# Patient Record
Sex: Male | Born: 1960 | Race: Black or African American | Hispanic: No | Marital: Single | Smoking: Current every day smoker
Health system: Southern US, Community
[De-identification: ages and names within clinical notes are randomized; demographics above are authoritative.]

## PROBLEM LIST (undated history)

## (undated) DIAGNOSIS — F32A Depression, unspecified: Secondary | ICD-10-CM

## (undated) DIAGNOSIS — Z21 Asymptomatic human immunodeficiency virus [HIV] infection status: Secondary | ICD-10-CM

## (undated) DIAGNOSIS — F4310 Post-traumatic stress disorder, unspecified: Secondary | ICD-10-CM

## (undated) DIAGNOSIS — B20 Human immunodeficiency virus [HIV] disease: Secondary | ICD-10-CM

## (undated) DIAGNOSIS — F329 Major depressive disorder, single episode, unspecified: Secondary | ICD-10-CM

## (undated) HISTORY — PX: RECTAL SURGERY: SHX760

---

## 2002-06-19 ENCOUNTER — Emergency Department (HOSPITAL_COMMUNITY): Admission: EM | Admit: 2002-06-19 | Discharge: 2002-06-19 | Payer: Self-pay | Admitting: Emergency Medicine

## 2003-01-14 ENCOUNTER — Encounter: Payer: Self-pay | Admitting: Emergency Medicine

## 2003-01-14 ENCOUNTER — Emergency Department (HOSPITAL_COMMUNITY): Admission: EM | Admit: 2003-01-14 | Discharge: 2003-01-14 | Payer: Self-pay | Admitting: Emergency Medicine

## 2003-08-24 ENCOUNTER — Emergency Department (HOSPITAL_COMMUNITY): Admission: EM | Admit: 2003-08-24 | Discharge: 2003-08-25 | Payer: Self-pay

## 2004-01-11 ENCOUNTER — Emergency Department (HOSPITAL_COMMUNITY): Admission: EM | Admit: 2004-01-11 | Discharge: 2004-01-11 | Payer: Self-pay | Admitting: Family Medicine

## 2006-03-31 ENCOUNTER — Emergency Department: Payer: Self-pay | Admitting: Emergency Medicine

## 2009-09-19 ENCOUNTER — Emergency Department: Payer: Self-pay | Admitting: Emergency Medicine

## 2015-09-17 ENCOUNTER — Encounter: Payer: Self-pay | Admitting: Emergency Medicine

## 2015-09-17 ENCOUNTER — Emergency Department: Payer: No Typology Code available for payment source

## 2015-09-17 ENCOUNTER — Emergency Department
Admission: EM | Admit: 2015-09-17 | Discharge: 2015-09-17 | Disposition: A | Discharge status: Home / Self Care | Payer: No Typology Code available for payment source | Attending: Student | Admitting: Student

## 2015-09-17 DIAGNOSIS — Y9389 Activity, other specified: Secondary | ICD-10-CM | POA: Diagnosis not present

## 2015-09-17 DIAGNOSIS — S299XXA Unspecified injury of thorax, initial encounter: Secondary | ICD-10-CM | POA: Diagnosis present

## 2015-09-17 DIAGNOSIS — F17210 Nicotine dependence, cigarettes, uncomplicated: Secondary | ICD-10-CM | POA: Diagnosis not present

## 2015-09-17 DIAGNOSIS — Y999 Unspecified external cause status: Secondary | ICD-10-CM | POA: Insufficient documentation

## 2015-09-17 DIAGNOSIS — S20219A Contusion of unspecified front wall of thorax, initial encounter: Secondary | ICD-10-CM | POA: Diagnosis not present

## 2015-09-17 DIAGNOSIS — Y92410 Unspecified street and highway as the place of occurrence of the external cause: Secondary | ICD-10-CM | POA: Insufficient documentation

## 2015-09-17 MED ORDER — NAPROXEN 500 MG PO TBEC: 500.0000 mg | DELAYED_RELEASE_TABLET | Freq: Two times a day (BID) | ORAL | Status: DC

## 2015-09-17 MED ORDER — CYCLOBENZAPRINE HCL 5 MG PO TABS: 5.0000 mg | ORAL_TABLET | Freq: Three times a day (TID) | ORAL | Status: DC | PRN
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
## 2015-10-24 ENCOUNTER — Emergency Department: Payer: Medicare Other

## 2015-10-24 ENCOUNTER — Emergency Department
Admission: EM | Admit: 2015-10-24 | Discharge: 2015-10-24 | Disposition: A | Discharge status: Home / Self Care | Payer: Medicare Other | Attending: Student | Admitting: Student

## 2015-10-24 ENCOUNTER — Encounter: Payer: Self-pay | Admitting: Emergency Medicine

## 2015-10-24 DIAGNOSIS — B20 Human immunodeficiency virus [HIV] disease: Secondary | ICD-10-CM | POA: Diagnosis not present

## 2015-10-24 DIAGNOSIS — M549 Dorsalgia, unspecified: Secondary | ICD-10-CM

## 2015-10-24 DIAGNOSIS — M545 Low back pain: Secondary | ICD-10-CM | POA: Diagnosis present

## 2015-10-24 DIAGNOSIS — Z79899 Other long term (current) drug therapy: Secondary | ICD-10-CM | POA: Insufficient documentation

## 2015-10-24 DIAGNOSIS — F17210 Nicotine dependence, cigarettes, uncomplicated: Secondary | ICD-10-CM | POA: Diagnosis not present

## 2015-10-24 HISTORY — DX: Asymptomatic human immunodeficiency virus (hiv) infection status: Z21

## 2015-10-24 HISTORY — DX: Human immunodeficiency virus (HIV) disease: B20

## 2015-10-24 LAB — URINALYSIS COMPLETE WITH MICROSCOPIC (ARMC ONLY)
BILIRUBIN URINE: NEGATIVE
Bacteria, UA: NONE SEEN
GLUCOSE, UA: NEGATIVE mg/dL
Hgb urine dipstick: NEGATIVE
Ketones, ur: NEGATIVE mg/dL
LEUKOCYTES UA: NEGATIVE
NITRITE: NEGATIVE
Protein, ur: NEGATIVE mg/dL
SPECIFIC GRAVITY, URINE: 1.02 (ref 1.005–1.030)
pH: 6 (ref 5.0–8.0)

## 2015-10-24 MED ORDER — KETOROLAC TROMETHAMINE 30 MG/ML IJ SOLN
15.0000 mg | Freq: Once | INTRAMUSCULAR | Status: AC
  Administered 2015-10-24: 15 mg via INTRAMUSCULAR
  Filled 2015-10-24: qty 1

## 2015-10-24 MED ORDER — CYCLOBENZAPRINE HCL 5 MG PO TABS: 5.0000 mg | ORAL_TABLET | Freq: Three times a day (TID) | ORAL | Status: DC | PRN

## 2015-10-24 MED ORDER — NAPROXEN 500 MG PO TABS: 500.0000 mg | ORAL_TABLET | Freq: Two times a day (BID) | ORAL | Status: AC
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
## 2015-10-24 NOTE — ED Notes (Signed)
Pt verbalized understanding of discharge instructions. NAD at this time. 
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
## 2015-10-24 NOTE — ED Notes (Signed)
Patient transported to X-ray 

## 2016-01-06 ENCOUNTER — Emergency Department: Payer: Medicare Other

## 2016-01-06 ENCOUNTER — Emergency Department
Admission: EM | Admit: 2016-01-06 | Discharge: 2016-01-06 | Disposition: A | Discharge status: Home / Self Care | Payer: Medicare Other | Attending: Emergency Medicine | Admitting: Emergency Medicine

## 2016-01-06 ENCOUNTER — Encounter: Payer: Self-pay | Admitting: Emergency Medicine

## 2016-01-06 DIAGNOSIS — X58XXXA Exposure to other specified factors, initial encounter: Secondary | ICD-10-CM | POA: Insufficient documentation

## 2016-01-06 DIAGNOSIS — Z79899 Other long term (current) drug therapy: Secondary | ICD-10-CM | POA: Insufficient documentation

## 2016-01-06 DIAGNOSIS — Z21 Asymptomatic human immunodeficiency virus [HIV] infection status: Secondary | ICD-10-CM | POA: Diagnosis not present

## 2016-01-06 DIAGNOSIS — S46911A Strain of unspecified muscle, fascia and tendon at shoulder and upper arm level, right arm, initial encounter: Secondary | ICD-10-CM | POA: Diagnosis not present

## 2016-01-06 DIAGNOSIS — F17210 Nicotine dependence, cigarettes, uncomplicated: Secondary | ICD-10-CM | POA: Diagnosis not present

## 2016-01-06 DIAGNOSIS — Y999 Unspecified external cause status: Secondary | ICD-10-CM | POA: Insufficient documentation

## 2016-01-06 DIAGNOSIS — Y939 Activity, unspecified: Secondary | ICD-10-CM | POA: Diagnosis not present

## 2016-01-06 DIAGNOSIS — Z791 Long term (current) use of non-steroidal anti-inflammatories (NSAID): Secondary | ICD-10-CM | POA: Diagnosis not present

## 2016-01-06 DIAGNOSIS — Y929 Unspecified place or not applicable: Secondary | ICD-10-CM | POA: Insufficient documentation

## 2016-01-06 DIAGNOSIS — M25511 Pain in right shoulder: Secondary | ICD-10-CM | POA: Diagnosis present

## 2016-01-06 HISTORY — DX: Post-traumatic stress disorder, unspecified: F43.10

## 2016-01-06 HISTORY — DX: Depression, unspecified: F32.A

## 2016-01-06 HISTORY — DX: Major depressive disorder, single episode, unspecified: F32.9

## 2016-01-06 MED ORDER — METHOCARBAMOL 500 MG PO TABS: 500.0000 mg | ORAL_TABLET | Freq: Four times a day (QID) | ORAL | 1 refills | Status: DC

## 2016-01-06 MED ORDER — MELOXICAM 15 MG PO TABS: 15.0000 mg | ORAL_TABLET | Freq: Every day | ORAL | 1 refills | Status: DC

## 2016-01-06 MED ORDER — MELOXICAM 7.5 MG PO TABS
15.0000 mg | ORAL_TABLET | Freq: Once | ORAL | Status: AC
  Administered 2016-01-06: 15 mg via ORAL
  Filled 2016-01-06: qty 2

## 2016-01-06 NOTE — ED Triage Notes (Signed)
Patient presents to ED with c/o right shoulder pain for 2 weeks. Pt denies known injury to the shoulder. Pt reports relief with elevation, reports taking OTC tylenol without relief.

## 2016-01-06 NOTE — ED Notes (Signed)
See triage note, pt reports R shoulder pain x2-3 weeks.  Denies injury, sts pain has not gotten worse but that he is tried of it hurting.  Reports some relief w/ tylenol.

## 2016-01-06 NOTE — ED Provider Notes (Signed)
[facility] Emergency Department Provider Note  ____________________________________________  Time seen: Approximately 10:01 PM  I have reviewed the triage vital signs and the nursing notes.   HISTORY  Chief Complaint Shoulder Pain    HPI Paul Black is a 55 y.o. male who presents emergency department complaining of right shoulder pain 2-3 weeks. Patient denies any direct trauma to the site. He reports that pain feels like a "pulled muscle" to the posterior shoulder blade. Patient states that he receives some relief when he raises his hand above his head. He denies any numbness or tingling to his right upper extremity. He denies any neck pain. No other complaints. Patient straight Tylenol with no relief.   Past Medical History:  Diagnosis Date  . Depression   . HIV (human immunodeficiency virus infection) (HCC)   . PTSD (post-traumatic stress disorder)     There are no active problems to display for this patient.   Past Surgical History:  Procedure Laterality Date  . RECTAL SURGERY      Prior to Admission medications   Medication Sig Start Date End Date Taking? Authorizing Provider  cyclobenzaprine (FLEXERIL) 5 MG tablet Take 1 tablet (5 mg total) by mouth every 8 (eight) hours as needed for muscle spasms. 09/17/15   Jenise V Bacon Menshew, PA-C  cyclobenzaprine (FLEXERIL) 5 MG tablet Take 1 tablet (5 mg total) by mouth every 8 (eight) hours as needed for muscle spasms. Do not drive while taking this medication. 10/24/15   Gayla DossEryka A Gayle, MD  emtricitabine-tenofovir (TRUVADA) 200-300 MG tablet Take 1 tablet by mouth daily.    Historical Provider, MD  meloxicam (MOBIC) 15 MG tablet Take 1 tablet (15 mg total) by mouth daily. 01/06/16   Delorise RoyalsJonathan D Kalen Neidert, PA-C  methocarbamol (ROBAXIN) 500 MG tablet Take 1 tablet (500 mg total) by mouth 4 (four) times daily. 01/06/16   Delorise RoyalsJonathan D Barbarita Hutmacher, PA-C  naproxen (EC NAPROSYN) 500 MG EC tablet Take 1  tablet (500 mg total) by mouth 2 (two) times daily with a meal. 09/17/15   Jenise V Bacon Menshew, PA-C  naproxen (NAPROSYN) 500 MG tablet Take 1 tablet (500 mg total) by mouth 2 (two) times daily with a meal. 10/24/15 10/23/16  Gayla DossEryka A Gayle, MD  NORVIR 100 MG TABS tablet Take 100 mg by mouth daily. 09/27/15   Historical Provider, MD  PARoxetine (PAXIL) 40 MG tablet Take 40 mg by mouth daily as needed. For depression. 08/07/15   Historical Provider, MD  REYATAZ 300 MG capsule Take 300 mg by mouth daily. 09/27/15   Historical Provider, MD    Allergies Review of patient's allergies indicates no known allergies.  No family history on file.  Social History Social History  Substance Use Topics  . Smoking status: Current Every Day Smoker    Packs/day: 0.50    Types: Cigarettes  . Smokeless tobacco: Never Used  . Alcohol use No     Review of Systems  Constitutional: No fever/chills Cardiovascular: no chest pain. Respiratory: no cough. No SOB. Musculoskeletal: Positive for right shoulder pain Skin: Negative for rash, abrasions, lacerations, ecchymosis. Neurological: Negative for headaches, focal weakness or numbness. 10-point ROS otherwise negative.  ____________________________________________   PHYSICAL EXAM:  VITAL SIGNS: ED Triage Vitals [01/06/16 2121]  Enc Vitals Group     BP 134/89     Pulse Rate 97     Resp 18     Temp 98.4 F (36.9 C)     Temp Source Oral  SpO2 98 %     Weight 189 lb (85.7 kg)     Height 6\' 1"  (1.854 m)     Head Circumference      Peak Flow      Pain Score 9     Pain Loc      Pain Edu?      Excl. in [place]?      Constitutional: Alert and oriented. Well appearing and in no acute distress. Eyes: Conjunctivae are normal. PERRL. EOMI. Head: Atraumatic. Cardiovascular: Normal rate, regular rhythm. Normal S1 and S2.  Good peripheral circulation. Respiratory: Normal respiratory effort without tachypnea or retractions. Lungs CTAB. Good air entry to the  bases with no decreased or absent breath sounds. Musculoskeletal: Full range of motion to all extremities. No gross deformities appreciated.No deformity, edema noted to right shoulder. Full range of motion right shoulder. Patient is nontender to palpation of the osseous landmarks of the shoulder. Patient is diffusely tender to palpation over the posterior muscle group. No palpable abnormality. Sensation and radial pulse intact distally. Neurologic:  Normal speech and language. No gross focal neurologic deficits are appreciated.  Skin:  Skin is warm, dry and intact. No rash noted. Psychiatric: Mood and affect are normal. Speech and behavior are normal. Patient exhibits appropriate insight and judgement.   ____________________________________________   LABS (all labs ordered are listed, but only abnormal results are displayed)  Labs Reviewed - No data to display ____________________________________________  EKG   ____________________________________________  RADIOLOGY Festus Barren Meilin Brosh, personally viewed and evaluated these images (plain radiographs) as part of my medical decision making, as well as reviewing the written report by the radiologist.  Dg Shoulder Right  Result Date: 01/06/2016 CLINICAL DATA:  RIGHT shoulder pain for 2 weeks, no known injury EXAM: RIGHT SHOULDER - 2+ VIEW COMPARISON:  None FINDINGS: Osseous mineralization grossly normal. AC joint alignment normal. No acute fracture, dislocation, or bone destruction. Visualized RIGHT ribs intact. IMPRESSION: No acute abnormalities. Electronically Signed   By: Ulyses Southward M.D.   On: 01/06/2016 22:07    ____________________________________________    PROCEDURES  Procedure(s) performed:    Procedures    Medications  meloxicam (MOBIC) tablet 15 mg (not administered)     ____________________________________________   INITIAL IMPRESSION / ASSESSMENT AND PLAN / ED COURSE  Pertinent labs & imaging results  that were available during my care of the patient were reviewed by me and considered in my medical decision making (see chart for details).  Clinical Course    Patient's diagnosis is consistent with Right shoulder strain. Exam is reassuring. X-ray reveals no acute osseous abnormality.. Patient will be discharged home with prescriptions for anti-inflammatories and muscle relaxer. Patient is to follow up with primary care provider as needed or otherwise directed. Patient is given ED precautions to return to the ED for any worsening or new symptoms.     ____________________________________________  FINAL CLINICAL IMPRESSION(S) / ED DIAGNOSES  Final diagnoses:  Right shoulder strain, initial encounter      NEW MEDICATIONS STARTED DURING THIS VISIT:  New Prescriptions   MELOXICAM (MOBIC) 15 MG TABLET    Take 1 tablet (15 mg total) by mouth daily.   METHOCARBAMOL (ROBAXIN) 500 MG TABLET    Take 1 tablet (500 mg total) by mouth 4 (four) times daily.        This chart was dictated using voice recognition software/Dragon. Despite best efforts to proofread, errors can occur which can change the meaning. Any change was purely unintentional.  Delorise RoyalsJonathan D Martavis Gurney, PA-C 01/06/16 2222    Loleta Roseory Forbach, MD 01/07/16 [phone]

## 2019-02-26 ENCOUNTER — Encounter: Payer: Self-pay | Admitting: Emergency Medicine

## 2019-02-26 ENCOUNTER — Emergency Department: Payer: Medicare Other

## 2019-02-26 ENCOUNTER — Other Ambulatory Visit: Payer: Self-pay

## 2019-02-26 ENCOUNTER — Emergency Department
Admission: EM | Admit: 2019-02-26 | Discharge: 2019-02-26 | Disposition: A | Discharge status: Home / Self Care | Payer: Medicare Other | Attending: Emergency Medicine | Admitting: Emergency Medicine

## 2019-02-26 DIAGNOSIS — R202 Paresthesia of skin: Secondary | ICD-10-CM | POA: Diagnosis not present

## 2019-02-26 DIAGNOSIS — R200 Anesthesia of skin: Secondary | ICD-10-CM | POA: Insufficient documentation

## 2019-02-26 DIAGNOSIS — Z79899 Other long term (current) drug therapy: Secondary | ICD-10-CM | POA: Insufficient documentation

## 2019-02-26 DIAGNOSIS — F17210 Nicotine dependence, cigarettes, uncomplicated: Secondary | ICD-10-CM | POA: Insufficient documentation

## 2019-02-26 DIAGNOSIS — B20 Human immunodeficiency virus [HIV] disease: Secondary | ICD-10-CM | POA: Insufficient documentation

## 2019-02-26 DIAGNOSIS — M542 Cervicalgia: Secondary | ICD-10-CM | POA: Diagnosis present

## 2019-02-26 DIAGNOSIS — R519 Headache, unspecified: Secondary | ICD-10-CM | POA: Insufficient documentation

## 2019-02-26 MED ORDER — ONDANSETRON 4 MG PO TBDP
4.0000 mg | ORAL_TABLET | Freq: Once | ORAL | Status: AC
  Administered 2019-02-26: 18:00:00 4 mg via ORAL
  Filled 2019-02-26: qty 1

## 2019-02-26 MED ORDER — MELOXICAM 15 MG PO TABS: 15.0000 mg | ORAL_TABLET | Freq: Every day | ORAL | 1 refills | Status: AC

## 2019-02-26 MED ORDER — HYDROCODONE-ACETAMINOPHEN 5-325 MG PO TABS
1.0000 | ORAL_TABLET | Freq: Once | ORAL | Status: AC
  Administered 2019-02-26: 18:00:00 1 via ORAL
  Filled 2019-02-26: qty 1

## 2019-02-26 MED ORDER — METHOCARBAMOL 500 MG PO TABS: 500.0000 mg | ORAL_TABLET | Freq: Three times a day (TID) | ORAL | 0 refills | Status: AC | PRN
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
## 2020-04-18 ENCOUNTER — Emergency Department
Admission: EM | Admit: 2020-04-18 | Discharge: 2020-04-18 | Disposition: A | Discharge status: Home / Self Care | Payer: No Typology Code available for payment source | Attending: Emergency Medicine | Admitting: Emergency Medicine

## 2020-04-18 ENCOUNTER — Other Ambulatory Visit: Payer: Self-pay

## 2020-04-18 ENCOUNTER — Emergency Department: Payer: No Typology Code available for payment source

## 2020-04-18 DIAGNOSIS — Z79899 Other long term (current) drug therapy: Secondary | ICD-10-CM | POA: Insufficient documentation

## 2020-04-18 DIAGNOSIS — R519 Headache, unspecified: Secondary | ICD-10-CM | POA: Diagnosis present

## 2020-04-18 DIAGNOSIS — M542 Cervicalgia: Secondary | ICD-10-CM | POA: Diagnosis not present

## 2020-04-18 DIAGNOSIS — M549 Dorsalgia, unspecified: Secondary | ICD-10-CM | POA: Insufficient documentation

## 2020-04-18 DIAGNOSIS — F17210 Nicotine dependence, cigarettes, uncomplicated: Secondary | ICD-10-CM | POA: Insufficient documentation

## 2020-04-18 DIAGNOSIS — Z21 Asymptomatic human immunodeficiency virus [HIV] infection status: Secondary | ICD-10-CM | POA: Insufficient documentation

## 2020-04-18 MED ORDER — CELECOXIB 50 MG PO CAPS: 50.0000 mg | ORAL_CAPSULE | Freq: Two times a day (BID) | ORAL | 0 refills | Status: AC

## 2020-04-18 MED ORDER — METHOCARBAMOL 500 MG PO TABS: 500.0000 mg | ORAL_TABLET | Freq: Three times a day (TID) | ORAL | 0 refills | Status: AC | PRN
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
## 2021-09-13 ENCOUNTER — Other Ambulatory Visit: Payer: Self-pay | Admitting: Infectious Diseases

## 2021-09-13 DIAGNOSIS — N1831 Chronic kidney disease, stage 3a: Secondary | ICD-10-CM

## 2021-12-31 ENCOUNTER — Emergency Department: Payer: Medicare (Managed Care)

## 2021-12-31 ENCOUNTER — Other Ambulatory Visit: Payer: Self-pay

## 2021-12-31 ENCOUNTER — Emergency Department
Admission: EM | Admit: 2021-12-31 | Discharge: 2021-12-31 | Disposition: A | Discharge status: Home / Self Care | Payer: Medicare (Managed Care) | Attending: Emergency Medicine | Admitting: Emergency Medicine

## 2021-12-31 ENCOUNTER — Encounter: Payer: Self-pay | Admitting: Emergency Medicine

## 2021-12-31 DIAGNOSIS — M7989 Other specified soft tissue disorders: Secondary | ICD-10-CM | POA: Diagnosis present

## 2021-12-31 DIAGNOSIS — L03012 Cellulitis of left finger: Secondary | ICD-10-CM | POA: Insufficient documentation

## 2021-12-31 MED ORDER — DOXYCYCLINE HYCLATE 100 MG PO CAPS: 100.0000 mg | ORAL_CAPSULE | Freq: Two times a day (BID) | ORAL | 0 refills | Status: AC

## 2021-12-31 MED ORDER — LIDOCAINE-EPINEPHRINE-TETRACAINE (LET) TOPICAL GEL
3.0000 mL | Freq: Once | TOPICAL | Status: AC
  Administered 2021-12-31: 3 mL via TOPICAL
  Filled 2021-12-31: qty 3

## 2021-12-31 MED ORDER — DOXYCYCLINE HYCLATE 100 MG PO TABS
100.0000 mg | ORAL_TABLET | Freq: Once | ORAL | Status: AC
  Administered 2021-12-31: 100 mg via ORAL
  Filled 2021-12-31: qty 1

## 2021-12-31 MED ORDER — CEPHALEXIN 500 MG PO CAPS
500.0000 mg | ORAL_CAPSULE | Freq: Once | ORAL | Status: AC
  Administered 2021-12-31: 500 mg via ORAL
  Filled 2021-12-31: qty 1

## 2021-12-31 MED ORDER — CEFADROXIL 500 MG PO CAPS: 500.0000 mg | ORAL_CAPSULE | Freq: Two times a day (BID) | ORAL | 0 refills | Status: AC

## 2021-12-31 NOTE — ED Provider Notes (Signed)
[facility] Provider Note    Event Date/Time   First MD Initiated Contact with Patient 12/31/21 [phone]     (approximate)   History   Hand Pain   HPI  Paul Black is a 61 y.o. male who presents for evaluation of pain and swelling to the left index finger.  He said it is gradually been worsening over the last 2 weeks and the pain has become quite severe.  He said that it started because he had a hangnail that he hold off, and after that he developed some redness and swelling right around the area hangnail.  It has subsequently spread to include most of the distal phalanx.  It hurts when he moves it.  It has not spread all the way up his finger.  No recent fevers or chills.     Physical Exam   Triage Vital Signs: ED Triage Vitals  Enc Vitals Group     BP 12/31/21 0101 (!) 152/91     Pulse Rate 12/31/21 0101 89     Resp 12/31/21 0101 16     Temp 12/31/21 0101 98.1 F (36.7 C)     Temp Source 12/31/21 0101 Oral     SpO2 12/31/21 0101 98 %     Weight 12/31/21 0102 91.6 kg (202 lb)     Height 12/31/21 0102 1.854 m (6\' 1" )     Head Circumference --      Peak Flow --      Pain Score 12/31/21 0102 9     Pain Loc --      Pain Edu? --      Excl. in GC? --     Most recent vital signs: Vitals:   12/31/21 0101 12/31/21 0656  BP: (!) 152/91 134/76  Pulse: 89 88  Resp: 16 18  Temp: 98.1 F (36.7 C)   SpO2: 98% 98%     General: Awake, no distress.  Resp:  Normal effort.  Abd:  No distention.  MSK:  Patient has tense circumferential swelling of the distal phalanx of the left index finger with obvious purulence all throughout the skin proximal to the nailbed consistent with a severe paronychia the swelling extends to the DIP, but does not involve the middle phalanx or extend proximally.  The patient is able to fully flex and extend his finger and although he has essentially fusiform swelling of the distal part of the digit, he does not have any of  the other [place] signs.   ED Results / Procedures / Treatments   Labs (all labs ordered are listed, but only abnormal results are displayed) Labs Reviewed  AEROBIC/ANAEROBIC CULTURE W GRAM STAIN (SURGICAL/DEEP WOUND)     RADIOLOGY I viewed and interpreted the patient's finger x-rays.  He has soft tissue swelling but no bony abnormality or involvement such as osteomyelitis.  I also read the radiologist's report, which confirmed no acute findings other than soft tissue swelling.03/02/22    PROCEDURES:  Critical Care performed: No  ..Incision and Drainage  Date/Time: 12/31/2021 6:30 AM  Performed by: 03/02/2022, MD Authorized by: Loleta Rose, MD   Consent:    Consent obtained:  Verbal   Consent given by:  Patient   Risks discussed:  Bleeding, infection, incomplete drainage and pain   Alternatives discussed:  Alternative treatment, delayed treatment and observation Universal protocol:    Patient identity confirmed:  Verbally with patient and arm band Location:    Type:  Abscess (Paronychia)  Location:  Upper extremity   Upper extremity location:  Finger   Finger location:  L index finger Anesthesia:    Anesthesia method:  Topical application   Topical anesthetic:  LET Procedure type:    Complexity:  Complex Procedure details:    Incision types:  Single straight   Wound management:  Probed and deloculated   Drainage:  Bloody and purulent   Drainage amount:  Copious   Wound treatment:  Wound left open Post-procedure details:    Procedure completion:  Tolerated well, no immediate complications    MEDICATIONS ORDERED IN ED: Medications  doxycycline (VIBRA-TABS) tablet 100 mg (has no administration in time range)  cephALEXin (KEFLEX) capsule 500 mg (has no administration in time range)  lidocaine-EPINEPHrine-tetracaine (LET) topical gel (3 mLs Topical Given 12/31/21 0604)     IMPRESSION / MDM / ASSESSMENT AND PLAN / ED COURSE  I reviewed the triage vital signs and  the nursing notes.                              Differential diagnosis includes, but is not limited to, paronychia, felon, flexor tenosynovitis or other deep base infection.  Patient's presentation is most consistent with acute complicated illness / injury requiring diagnostic workup.  Patient has an obvious paronychia has become quite severe over the last 2 weeks, but fortunately it does not extend proximally beyond the DIP.  No evidence of flexor tenosynovitis.  Reassuring radiograph with no osteomyelitis.  No nailbed involvement.  I drained the paronychia as described above and got out as much of the purulent material as possible.  I had my usual and customary post I&D discussion with the patient about how to do finger soaks, take the full course antibiotics, etc.  I strongly encouraged him to follow-up with Dr. Stephenie Acres or another orthopedic provider.  He understands and agrees with the plan.  I gave strict return precautions.      FINAL CLINICAL IMPRESSION(S) / ED DIAGNOSES   Final diagnoses:  Paronychia of finger, left     Rx / DC Orders   ED Discharge Orders          Ordered    doxycycline (VIBRAMYCIN) 100 MG capsule  2 times daily        12/31/21 0649    cefadroxil (DURICEF) 500 MG capsule  2 times daily        12/31/21 8546             Note:  This document was prepared using Dragon voice recognition software and may include unintentional dictation errors.   Loleta Rose, MD 12/31/21 0700

## 2021-12-31 NOTE — ED Triage Notes (Signed)
Pt presents to ER from home reports he had a skin peeled off from his left index finger. Pt has visible swelling, reports pain to finger. Pt talks in complete sentences no respiratory distress noted

## 2021-12-31 NOTE — ED Notes (Signed)
E-signature pad unavailable - Pt verbalized understanding of D/C information - no additional concerns at this time.  

## 2021-12-31 NOTE — Discharge Instructions (Signed)
Please read through the included information about paronychias.  You need to soak your finger several times a day to try to keep the wound open so it continues to drain out the infection.  Take the full course of both the prescribed antibiotics, even if it causes you to have an upset stomach and/or some diarrhea.  Please call the office of Dr. Stephenie Acres to schedule follow-up appointment with her or one of her colleagues.  If you find that your symptoms are getting worse, such as increased swelling or pain, or swelling and redness that is spreading up your finger and into your hand, please return immediately to the emergency department.

## 2022-01-05 LAB — AEROBIC/ANAEROBIC CULTURE W GRAM STAIN (SURGICAL/DEEP WOUND)

## 2022-06-22 ENCOUNTER — Other Ambulatory Visit: Payer: Self-pay

## 2022-06-22 ENCOUNTER — Emergency Department
Admission: EM | Admit: 2022-06-22 | Discharge: 2022-06-22 | Disposition: A | Discharge status: Home / Self Care | Payer: Medicare (Managed Care) | Attending: Emergency Medicine | Admitting: Emergency Medicine

## 2022-06-22 DIAGNOSIS — L409 Psoriasis, unspecified: Secondary | ICD-10-CM

## 2022-06-22 DIAGNOSIS — R21 Rash and other nonspecific skin eruption: Secondary | ICD-10-CM | POA: Insufficient documentation

## 2022-06-22 MED ORDER — TRIAMCINOLONE ACETONIDE 0.5 % EX OINT: 1.0000 | TOPICAL_OINTMENT | Freq: Two times a day (BID) | CUTANEOUS | 0 refills | Status: AC
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
## 2022-06-22 NOTE — ED Provider Notes (Signed)
   [facility] Provider Note    Event Date/Time   First MD Initiated Contact with Patient 06/22/22 2122     (approximate)   History   Rash   HPI  Paul Black is a 62 y.o. male who presents with complaints of rash for approximately 1 month.  He describes it as pruritic, he has it in the axilla bilaterally, on the neck and on the arms.  Has not take anything for it.     Physical Exam   Triage Vital Signs: ED Triage Vitals  Enc Vitals Group     BP 06/22/22 2039 (!) 140/92     Pulse Rate 06/22/22 2039 79     Resp 06/22/22 2039 18     Temp 06/22/22 2039 97.9 F (36.6 C)     Temp src --      SpO2 06/22/22 2039 97 %     Weight 06/22/22 2043 81.6 kg (180 lb)     Height 06/22/22 2043 1.854 m (6\' 1" )     Head Circumference --      Peak Flow --      Pain Score 06/22/22 2043 0     Pain Loc --      Pain Edu? --      Excl. in [place]? --     Most recent vital signs: Vitals:   06/22/22 2039  BP: (!) 140/92  Pulse: 79  Resp: 18  Temp: 97.9 F (36.6 C)  SpO2: 97%     General: Awake, no distress.  CV:  Good peripheral perfusion.  Resp:  Normal effort.  Abd:  No distention.  Other:  Lichenous rash to the axilla bilaterally, well-demarcated   ED Results / Procedures / Treatments   Labs (all labs ordered are listed, but only abnormal results are displayed) Labs Reviewed - No data to display   EKG     RADIOLOGY     PROCEDURES:  Critical Care performed:   Procedures   MEDICATIONS ORDERED IN ED: Medications - No data to display   IMPRESSION / MDM / [place] / ED COURSE  I reviewed the triage vital signs and the nursing notes. Patient's presentation is most consistent with acute, uncomplicated illness.   Patient with rash which appears to be in the skin folds primarily, suspicious for psoriasis.  Will trial steroid cream, patient to follow-up with dermatology       FINAL CLINICAL IMPRESSION(S) / ED  DIAGNOSES   Final diagnoses:  None     Rx / DC Orders   ED Discharge Orders          Ordered    Ambulatory referral to Dermatology        06/22/22 2148    triamcinolone ointment (KENALOG) 0.5 %  2 times daily        06/22/22 2150             Note:  This document was prepared using Dragon voice recognition software and may include unintentional dictation errors.   Lavonia Drafts, MD 06/22/22 2322

## 2022-06-22 NOTE — ED Notes (Signed)
E signature pad not working. Pt educated on discharge instructions and verbalized understanding.  

## 2022-09-07 DIAGNOSIS — B20 Human immunodeficiency virus [HIV] disease: Secondary | ICD-10-CM | POA: Diagnosis not present

## 2022-09-07 DIAGNOSIS — R21 Rash and other nonspecific skin eruption: Secondary | ICD-10-CM | POA: Diagnosis not present

## 2022-09-07 DIAGNOSIS — F17210 Nicotine dependence, cigarettes, uncomplicated: Secondary | ICD-10-CM | POA: Diagnosis not present

## 2022-09-11 DIAGNOSIS — H524 Presbyopia: Secondary | ICD-10-CM | POA: Diagnosis not present

## 2022-09-18 DIAGNOSIS — Z0100 Encounter for examination of eyes and vision without abnormal findings: Secondary | ICD-10-CM | POA: Diagnosis not present

## 2022-09-25 DIAGNOSIS — I10 Essential (primary) hypertension: Secondary | ICD-10-CM | POA: Diagnosis not present

## 2022-09-25 DIAGNOSIS — N183 Chronic kidney disease, stage 3 unspecified: Secondary | ICD-10-CM | POA: Diagnosis not present

## 2022-09-25 DIAGNOSIS — E785 Hyperlipidemia, unspecified: Secondary | ICD-10-CM | POA: Diagnosis not present

## 2022-09-25 DIAGNOSIS — F333 Major depressive disorder, recurrent, severe with psychotic symptoms: Secondary | ICD-10-CM | POA: Diagnosis not present

## 2022-09-25 DIAGNOSIS — B20 Human immunodeficiency virus [HIV] disease: Secondary | ICD-10-CM | POA: Diagnosis not present

## 2022-09-25 DIAGNOSIS — Z0000 Encounter for general adult medical examination without abnormal findings: Secondary | ICD-10-CM | POA: Diagnosis not present

## 2022-09-25 DIAGNOSIS — I129 Hypertensive chronic kidney disease with stage 1 through stage 4 chronic kidney disease, or unspecified chronic kidney disease: Secondary | ICD-10-CM | POA: Diagnosis not present

## 2022-09-25 DIAGNOSIS — Z125 Encounter for screening for malignant neoplasm of prostate: Secondary | ICD-10-CM | POA: Diagnosis not present

## 2022-09-25 DIAGNOSIS — N1831 Chronic kidney disease, stage 3a: Secondary | ICD-10-CM | POA: Diagnosis not present

## 2022-09-25 DIAGNOSIS — F32A Depression, unspecified: Secondary | ICD-10-CM | POA: Diagnosis not present

## 2022-09-25 DIAGNOSIS — R21 Rash and other nonspecific skin eruption: Secondary | ICD-10-CM | POA: Diagnosis not present

## 2022-09-25 DIAGNOSIS — Z122 Encounter for screening for malignant neoplasm of respiratory organs: Secondary | ICD-10-CM | POA: Diagnosis not present

## 2022-09-25 DIAGNOSIS — Z720 Tobacco use: Secondary | ICD-10-CM | POA: Diagnosis not present

## 2022-09-25 DIAGNOSIS — F17210 Nicotine dependence, cigarettes, uncomplicated: Secondary | ICD-10-CM | POA: Diagnosis not present

## 2022-09-26 ENCOUNTER — Encounter: Payer: Self-pay | Admitting: Infectious Diseases

## 2022-09-27 ENCOUNTER — Other Ambulatory Visit: Payer: Self-pay | Admitting: Infectious Diseases

## 2022-09-27 DIAGNOSIS — R21 Rash and other nonspecific skin eruption: Secondary | ICD-10-CM

## 2022-09-27 DIAGNOSIS — B20 Human immunodeficiency virus [HIV] disease: Secondary | ICD-10-CM

## 2022-09-27 DIAGNOSIS — Z720 Tobacco use: Secondary | ICD-10-CM

## 2022-09-27 DIAGNOSIS — N1831 Chronic kidney disease, stage 3a: Secondary | ICD-10-CM

## 2022-09-27 DIAGNOSIS — I10 Essential (primary) hypertension: Secondary | ICD-10-CM

## 2022-09-27 DIAGNOSIS — F333 Major depressive disorder, recurrent, severe with psychotic symptoms: Secondary | ICD-10-CM

## 2022-10-03 ENCOUNTER — Ambulatory Visit: Payer: Medicare HMO

## 2022-10-03 ENCOUNTER — Encounter: Payer: Self-pay | Admitting: *Deleted

## 2022-10-04 DIAGNOSIS — L986 Other infiltrative disorders of the skin and subcutaneous tissue: Secondary | ICD-10-CM | POA: Diagnosis not present

## 2022-10-04 DIAGNOSIS — L309 Dermatitis, unspecified: Secondary | ICD-10-CM | POA: Diagnosis not present

## 2022-10-06 DIAGNOSIS — J300 Vasomotor rhinitis: Secondary | ICD-10-CM | POA: Diagnosis not present

## 2022-10-06 DIAGNOSIS — R21 Rash and other nonspecific skin eruption: Secondary | ICD-10-CM | POA: Diagnosis not present

## 2022-10-09 DIAGNOSIS — Z136 Encounter for screening for cardiovascular disorders: Secondary | ICD-10-CM | POA: Diagnosis not present

## 2022-10-09 DIAGNOSIS — Z79899 Other long term (current) drug therapy: Secondary | ICD-10-CM | POA: Diagnosis not present

## 2022-10-09 DIAGNOSIS — Z113 Encounter for screening for infections with a predominantly sexual mode of transmission: Secondary | ICD-10-CM | POA: Diagnosis not present

## 2022-10-09 DIAGNOSIS — Z1331 Encounter for screening for depression: Secondary | ICD-10-CM | POA: Diagnosis not present

## 2022-10-09 DIAGNOSIS — Z21 Asymptomatic human immunodeficiency virus [HIV] infection status: Secondary | ICD-10-CM | POA: Diagnosis not present

## 2022-10-09 DIAGNOSIS — B20 Human immunodeficiency virus [HIV] disease: Secondary | ICD-10-CM | POA: Diagnosis not present

## 2022-10-09 DIAGNOSIS — Z85048 Personal history of other malignant neoplasm of rectum, rectosigmoid junction, and anus: Secondary | ICD-10-CM | POA: Diagnosis not present

## 2022-10-09 DIAGNOSIS — Z87891 Personal history of nicotine dependence: Secondary | ICD-10-CM | POA: Diagnosis not present

## 2022-10-09 DIAGNOSIS — C210 Malignant neoplasm of anus, unspecified: Secondary | ICD-10-CM | POA: Diagnosis not present

## 2022-10-09 DIAGNOSIS — F17210 Nicotine dependence, cigarettes, uncomplicated: Secondary | ICD-10-CM | POA: Diagnosis not present

## 2022-10-09 DIAGNOSIS — Z122 Encounter for screening for malignant neoplasm of respiratory organs: Secondary | ICD-10-CM | POA: Diagnosis not present

## 2022-11-09 DIAGNOSIS — L309 Dermatitis, unspecified: Secondary | ICD-10-CM | POA: Diagnosis not present

## 2022-11-16 DIAGNOSIS — L309 Dermatitis, unspecified: Secondary | ICD-10-CM | POA: Diagnosis not present

## 2022-12-06 DIAGNOSIS — L309 Dermatitis, unspecified: Secondary | ICD-10-CM | POA: Diagnosis not present

## 2022-12-06 DIAGNOSIS — L986 Other infiltrative disorders of the skin and subcutaneous tissue: Secondary | ICD-10-CM | POA: Diagnosis not present

## 2022-12-18 DIAGNOSIS — Z122 Encounter for screening for malignant neoplasm of respiratory organs: Secondary | ICD-10-CM | POA: Diagnosis not present

## 2022-12-18 DIAGNOSIS — Z87891 Personal history of nicotine dependence: Secondary | ICD-10-CM | POA: Diagnosis not present

## 2022-12-18 DIAGNOSIS — Z21 Asymptomatic human immunodeficiency virus [HIV] infection status: Secondary | ICD-10-CM | POA: Diagnosis not present

## 2022-12-18 DIAGNOSIS — R21 Rash and other nonspecific skin eruption: Secondary | ICD-10-CM | POA: Diagnosis not present

## 2022-12-18 DIAGNOSIS — E785 Hyperlipidemia, unspecified: Secondary | ICD-10-CM | POA: Diagnosis not present

## 2022-12-18 DIAGNOSIS — Z136 Encounter for screening for cardiovascular disorders: Secondary | ICD-10-CM | POA: Diagnosis not present

## 2022-12-18 DIAGNOSIS — I129 Hypertensive chronic kidney disease with stage 1 through stage 4 chronic kidney disease, or unspecified chronic kidney disease: Secondary | ICD-10-CM | POA: Diagnosis not present

## 2022-12-18 DIAGNOSIS — B20 Human immunodeficiency virus [HIV] disease: Secondary | ICD-10-CM | POA: Diagnosis not present

## 2022-12-18 DIAGNOSIS — N1831 Chronic kidney disease, stage 3a: Secondary | ICD-10-CM | POA: Diagnosis not present

## 2022-12-18 DIAGNOSIS — F17210 Nicotine dependence, cigarettes, uncomplicated: Secondary | ICD-10-CM | POA: Diagnosis not present

## 2022-12-18 DIAGNOSIS — F333 Major depressive disorder, recurrent, severe with psychotic symptoms: Secondary | ICD-10-CM | POA: Diagnosis not present

## 2022-12-22 DIAGNOSIS — L986 Other infiltrative disorders of the skin and subcutaneous tissue: Secondary | ICD-10-CM | POA: Diagnosis not present

## 2022-12-22 DIAGNOSIS — L309 Dermatitis, unspecified: Secondary | ICD-10-CM | POA: Diagnosis not present

## 2022-12-27 DIAGNOSIS — L986 Other infiltrative disorders of the skin and subcutaneous tissue: Secondary | ICD-10-CM | POA: Diagnosis not present

## 2023-01-08 DIAGNOSIS — B351 Tinea unguium: Secondary | ICD-10-CM | POA: Diagnosis not present

## 2023-01-08 DIAGNOSIS — Z21 Asymptomatic human immunodeficiency virus [HIV] infection status: Secondary | ICD-10-CM | POA: Diagnosis not present

## 2023-01-08 DIAGNOSIS — R21 Rash and other nonspecific skin eruption: Secondary | ICD-10-CM | POA: Diagnosis not present

## 2023-01-08 DIAGNOSIS — F17210 Nicotine dependence, cigarettes, uncomplicated: Secondary | ICD-10-CM | POA: Diagnosis not present

## 2023-01-08 DIAGNOSIS — B20 Human immunodeficiency virus [HIV] disease: Secondary | ICD-10-CM | POA: Diagnosis not present

## 2023-01-08 DIAGNOSIS — E782 Mixed hyperlipidemia: Secondary | ICD-10-CM | POA: Diagnosis not present

## 2023-01-08 DIAGNOSIS — L603 Nail dystrophy: Secondary | ICD-10-CM | POA: Diagnosis not present

## 2023-01-08 DIAGNOSIS — I10 Essential (primary) hypertension: Secondary | ICD-10-CM | POA: Diagnosis not present

## 2023-01-08 DIAGNOSIS — Z79899 Other long term (current) drug therapy: Secondary | ICD-10-CM | POA: Diagnosis not present

## 2023-04-02 DIAGNOSIS — B20 Human immunodeficiency virus [HIV] disease: Secondary | ICD-10-CM | POA: Diagnosis not present

## 2023-04-02 DIAGNOSIS — F17210 Nicotine dependence, cigarettes, uncomplicated: Secondary | ICD-10-CM | POA: Diagnosis not present

## 2023-04-02 DIAGNOSIS — E782 Mixed hyperlipidemia: Secondary | ICD-10-CM | POA: Diagnosis not present

## 2023-04-02 DIAGNOSIS — Z23 Encounter for immunization: Secondary | ICD-10-CM | POA: Diagnosis not present

## 2023-04-02 DIAGNOSIS — Z1211 Encounter for screening for malignant neoplasm of colon: Secondary | ICD-10-CM | POA: Diagnosis not present

## 2023-04-02 DIAGNOSIS — Z79899 Other long term (current) drug therapy: Secondary | ICD-10-CM | POA: Diagnosis not present

## 2023-04-02 DIAGNOSIS — Z21 Asymptomatic human immunodeficiency virus [HIV] infection status: Secondary | ICD-10-CM | POA: Diagnosis not present

## 2023-04-02 DIAGNOSIS — I10 Essential (primary) hypertension: Secondary | ICD-10-CM | POA: Diagnosis not present

## 2023-06-21 ENCOUNTER — Ambulatory Visit: Payer: Medicare (Managed Care) | Admitting: Dermatology

## 2023-07-02 DIAGNOSIS — I10 Essential (primary) hypertension: Secondary | ICD-10-CM | POA: Diagnosis not present

## 2023-07-02 DIAGNOSIS — B351 Tinea unguium: Secondary | ICD-10-CM | POA: Diagnosis not present

## 2023-07-02 DIAGNOSIS — B20 Human immunodeficiency virus [HIV] disease: Secondary | ICD-10-CM | POA: Diagnosis not present

## 2023-07-02 DIAGNOSIS — F17210 Nicotine dependence, cigarettes, uncomplicated: Secondary | ICD-10-CM | POA: Diagnosis not present

## 2023-07-02 DIAGNOSIS — Z79899 Other long term (current) drug therapy: Secondary | ICD-10-CM | POA: Diagnosis not present

## 2023-07-02 DIAGNOSIS — Z21 Asymptomatic human immunodeficiency virus [HIV] infection status: Secondary | ICD-10-CM | POA: Diagnosis not present

## 2023-07-02 DIAGNOSIS — Z1211 Encounter for screening for malignant neoplasm of colon: Secondary | ICD-10-CM | POA: Diagnosis not present

## 2023-08-14 ENCOUNTER — Ambulatory Visit: Payer: Medicare (Managed Care) | Admitting: Dermatology

## 2023-08-27 DIAGNOSIS — F17210 Nicotine dependence, cigarettes, uncomplicated: Secondary | ICD-10-CM | POA: Diagnosis not present

## 2023-08-27 DIAGNOSIS — M79675 Pain in left toe(s): Secondary | ICD-10-CM | POA: Diagnosis not present

## 2023-08-27 DIAGNOSIS — Y990 Civilian activity done for income or pay: Secondary | ICD-10-CM | POA: Diagnosis not present

## 2023-08-27 DIAGNOSIS — M79674 Pain in right toe(s): Secondary | ICD-10-CM | POA: Diagnosis not present

## 2023-08-27 DIAGNOSIS — L853 Xerosis cutis: Secondary | ICD-10-CM | POA: Diagnosis not present

## 2023-08-27 DIAGNOSIS — S4992XA Unspecified injury of left shoulder and upper arm, initial encounter: Secondary | ICD-10-CM | POA: Diagnosis not present

## 2023-08-27 DIAGNOSIS — M25512 Pain in left shoulder: Secondary | ICD-10-CM | POA: Diagnosis not present

## 2023-08-27 DIAGNOSIS — L852 Keratosis punctata (palmaris et plantaris): Secondary | ICD-10-CM | POA: Diagnosis not present

## 2023-08-27 DIAGNOSIS — B351 Tinea unguium: Secondary | ICD-10-CM | POA: Diagnosis not present

## 2023-08-27 DIAGNOSIS — Y9289 Other specified places as the place of occurrence of the external cause: Secondary | ICD-10-CM | POA: Diagnosis not present

## 2023-08-27 DIAGNOSIS — D485 Neoplasm of uncertain behavior of skin: Secondary | ICD-10-CM | POA: Diagnosis not present

## 2023-08-27 DIAGNOSIS — W010XXA Fall on same level from slipping, tripping and stumbling without subsequent striking against object, initial encounter: Secondary | ICD-10-CM | POA: Diagnosis not present

## 2023-09-23 DIAGNOSIS — M12812 Other specific arthropathies, not elsewhere classified, left shoulder: Secondary | ICD-10-CM | POA: Diagnosis not present

## 2023-09-23 DIAGNOSIS — M75102 Unspecified rotator cuff tear or rupture of left shoulder, not specified as traumatic: Secondary | ICD-10-CM | POA: Diagnosis not present

## 2023-09-23 DIAGNOSIS — M19012 Primary osteoarthritis, left shoulder: Secondary | ICD-10-CM | POA: Diagnosis not present

## 2024-03-03 DIAGNOSIS — I10 Essential (primary) hypertension: Secondary | ICD-10-CM | POA: Diagnosis not present

## 2024-03-03 DIAGNOSIS — R55 Syncope and collapse: Secondary | ICD-10-CM | POA: Diagnosis not present

## 2024-03-03 DIAGNOSIS — Z1331 Encounter for screening for depression: Secondary | ICD-10-CM | POA: Diagnosis not present

## 2024-03-03 DIAGNOSIS — B20 Human immunodeficiency virus [HIV] disease: Secondary | ICD-10-CM | POA: Diagnosis not present

## 2024-03-03 DIAGNOSIS — F17210 Nicotine dependence, cigarettes, uncomplicated: Secondary | ICD-10-CM | POA: Diagnosis not present

## 2024-03-03 DIAGNOSIS — Z23 Encounter for immunization: Secondary | ICD-10-CM | POA: Diagnosis not present

## 2024-03-03 DIAGNOSIS — Z79899 Other long term (current) drug therapy: Secondary | ICD-10-CM | POA: Diagnosis not present

## 2024-03-03 DIAGNOSIS — Z113 Encounter for screening for infections with a predominantly sexual mode of transmission: Secondary | ICD-10-CM | POA: Diagnosis not present

## 2024-03-03 DIAGNOSIS — K629 Disease of anus and rectum, unspecified: Secondary | ICD-10-CM | POA: Diagnosis not present

## 2024-03-03 DIAGNOSIS — Z21 Asymptomatic human immunodeficiency virus [HIV] infection status: Secondary | ICD-10-CM | POA: Diagnosis not present

## 2024-03-10 DIAGNOSIS — L853 Xerosis cutis: Secondary | ICD-10-CM | POA: Diagnosis not present

## 2024-03-10 DIAGNOSIS — D013 Carcinoma in situ of anus and anal canal: Secondary | ICD-10-CM | POA: Diagnosis not present

## 2024-03-10 DIAGNOSIS — D485 Neoplasm of uncertain behavior of skin: Secondary | ICD-10-CM | POA: Diagnosis not present

## 2024-03-10 DIAGNOSIS — A630 Anogenital (venereal) warts: Secondary | ICD-10-CM | POA: Diagnosis not present

## 2024-03-17 DIAGNOSIS — Z1331 Encounter for screening for depression: Secondary | ICD-10-CM | POA: Diagnosis not present

## 2024-03-17 DIAGNOSIS — N179 Acute kidney failure, unspecified: Secondary | ICD-10-CM | POA: Diagnosis not present

## 2024-03-17 DIAGNOSIS — I10 Essential (primary) hypertension: Secondary | ICD-10-CM | POA: Diagnosis not present

## 2024-03-17 DIAGNOSIS — F17210 Nicotine dependence, cigarettes, uncomplicated: Secondary | ICD-10-CM | POA: Diagnosis not present

## 2024-03-17 DIAGNOSIS — Z21 Asymptomatic human immunodeficiency virus [HIV] infection status: Secondary | ICD-10-CM | POA: Diagnosis not present

## 2024-03-17 DIAGNOSIS — C210 Malignant neoplasm of anus, unspecified: Secondary | ICD-10-CM | POA: Diagnosis not present

## 2024-03-17 DIAGNOSIS — B20 Human immunodeficiency virus [HIV] disease: Secondary | ICD-10-CM | POA: Diagnosis not present

## 2024-04-14 DIAGNOSIS — I10 Essential (primary) hypertension: Secondary | ICD-10-CM | POA: Diagnosis not present

## 2024-04-14 DIAGNOSIS — N179 Acute kidney failure, unspecified: Secondary | ICD-10-CM | POA: Diagnosis not present

## 2024-04-14 DIAGNOSIS — Z1331 Encounter for screening for depression: Secondary | ICD-10-CM | POA: Diagnosis not present

## 2024-04-14 DIAGNOSIS — Z79899 Other long term (current) drug therapy: Secondary | ICD-10-CM | POA: Diagnosis not present

## 2024-04-21 DIAGNOSIS — D013 Carcinoma in situ of anus and anal canal: Secondary | ICD-10-CM | POA: Diagnosis not present

## 2024-06-20 ENCOUNTER — Emergency Department

## 2024-06-20 ENCOUNTER — Emergency Department
Admission: EM | Admit: 2024-06-20 | Discharge: 2024-06-20 | Disposition: A | Discharge status: Home / Self Care | Attending: Emergency Medicine | Admitting: Emergency Medicine

## 2024-06-20 ENCOUNTER — Other Ambulatory Visit: Payer: Self-pay

## 2024-06-20 DIAGNOSIS — M25511 Pain in right shoulder: Secondary | ICD-10-CM | POA: Diagnosis not present

## 2024-06-20 DIAGNOSIS — R079 Chest pain, unspecified: Secondary | ICD-10-CM | POA: Diagnosis not present

## 2024-06-20 DIAGNOSIS — Y92481 Parking lot as the place of occurrence of the external cause: Secondary | ICD-10-CM | POA: Insufficient documentation

## 2024-06-20 DIAGNOSIS — S199XXA Unspecified injury of neck, initial encounter: Secondary | ICD-10-CM | POA: Diagnosis present

## 2024-06-20 DIAGNOSIS — S161XXA Strain of muscle, fascia and tendon at neck level, initial encounter: Secondary | ICD-10-CM | POA: Diagnosis not present

## 2024-06-20 MED ORDER — BACLOFEN 5 MG PO TABS: 5.0000 mg | ORAL_TABLET | Freq: Two times a day (BID) | ORAL | 0 refills | Status: AC | PRN
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
## 2024-06-20 NOTE — ED Notes (Signed)
 Discharge instructions reviewed with patient. Patient questions answered and opportunity for education reviewed. Patient voices understanding of discharge instructions with no further questions. Patient ambulatory with steady gait to lobby.
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
